# Patient Record
Sex: Female | Born: 1980 | Race: White | Hispanic: No | Marital: Married | Smoking: Never smoker
Health system: Southern US, Community
[De-identification: ages and names within clinical notes are randomized; demographics above are authoritative.]

---

## 2000-08-18 ENCOUNTER — Ambulatory Visit (HOSPITAL_COMMUNITY): Admission: RE | Admit: 2000-08-18 | Discharge: 2000-08-18 | Payer: Self-pay | Admitting: Family Medicine

## 2000-08-18 ENCOUNTER — Encounter: Payer: Self-pay | Admitting: Family Medicine

## 2001-02-16 ENCOUNTER — Emergency Department (HOSPITAL_COMMUNITY): Admission: EM | Admit: 2001-02-16 | Discharge: 2001-02-16 | Payer: Self-pay | Admitting: Emergency Medicine

## 2002-03-06 ENCOUNTER — Encounter: Payer: Self-pay | Admitting: Emergency Medicine

## 2002-03-06 ENCOUNTER — Emergency Department (HOSPITAL_COMMUNITY): Admission: EM | Admit: 2002-03-06 | Discharge: 2002-03-06 | Payer: Self-pay | Admitting: Emergency Medicine

## 2004-01-22 ENCOUNTER — Emergency Department (HOSPITAL_COMMUNITY): Admission: EM | Admit: 2004-01-22 | Discharge: 2004-01-22 | Payer: Self-pay | Admitting: Emergency Medicine

## 2004-04-02 ENCOUNTER — Observation Stay (HOSPITAL_COMMUNITY): Admission: AD | Admit: 2004-04-02 | Discharge: 2004-04-03 | Payer: Self-pay | Admitting: *Deleted

## 2004-04-17 ENCOUNTER — Ambulatory Visit (HOSPITAL_COMMUNITY): Admission: RE | Admit: 2004-04-17 | Discharge: 2004-04-17 | Payer: Self-pay | Admitting: *Deleted

## 2004-06-23 ENCOUNTER — Ambulatory Visit (HOSPITAL_COMMUNITY): Admission: RE | Admit: 2004-06-23 | Discharge: 2004-06-23 | Payer: Self-pay | Admitting: *Deleted

## 2004-07-03 ENCOUNTER — Observation Stay (HOSPITAL_COMMUNITY): Admission: AD | Admit: 2004-07-03 | Discharge: 2004-07-03 | Payer: Self-pay | Admitting: *Deleted

## 2004-07-17 ENCOUNTER — Ambulatory Visit (HOSPITAL_COMMUNITY): Admission: RE | Admit: 2004-07-17 | Discharge: 2004-07-17 | Payer: Self-pay | Admitting: *Deleted

## 2005-03-11 ENCOUNTER — Emergency Department (HOSPITAL_COMMUNITY): Admission: EM | Admit: 2005-03-11 | Discharge: 2005-03-11 | Payer: Self-pay | Admitting: Emergency Medicine

## 2006-02-21 ENCOUNTER — Emergency Department (HOSPITAL_COMMUNITY): Admission: EM | Admit: 2006-02-21 | Discharge: 2006-02-22 | Payer: Self-pay | Admitting: Emergency Medicine

## 2006-02-22 ENCOUNTER — Emergency Department (HOSPITAL_COMMUNITY): Admission: RE | Admit: 2006-02-22 | Discharge: 2006-02-22 | Payer: Self-pay | Admitting: Emergency Medicine

## 2006-04-26 ENCOUNTER — Emergency Department (HOSPITAL_COMMUNITY): Admission: EM | Admit: 2006-04-26 | Discharge: 2006-04-26 | Payer: Self-pay | Admitting: Emergency Medicine

## 2006-06-09 ENCOUNTER — Emergency Department (HOSPITAL_COMMUNITY): Admission: EM | Admit: 2006-06-09 | Discharge: 2006-06-09 | Payer: Self-pay | Admitting: Emergency Medicine

## 2007-10-04 ENCOUNTER — Other Ambulatory Visit: Admission: RE | Admit: 2007-10-04 | Discharge: 2007-10-04 | Payer: Self-pay | Admitting: Obstetrics and Gynecology

## 2007-10-19 ENCOUNTER — Ambulatory Visit (HOSPITAL_COMMUNITY): Admission: RE | Admit: 2007-10-19 | Discharge: 2007-10-19 | Payer: Self-pay | Admitting: Obstetrics & Gynecology

## 2007-11-06 IMAGING — US US OB COMP LESS 14 WK
1 series · 14 of 28 positions shown · non-contrast
Comparison: none

CLINICAL DATA: Cramping lower abdominal area.  Bleeding.  Quantitative beta hCG 2905.  
OBSTETRICAL ULTRASOUND <14 WKS AND TRANSVAGINAL OB US:
TECHNIQUE: Both transabdominal and transvaginal ultrasound examinations were performed for complete evaluation of the gestation as well as the maternal uterus, adnexal regions, and pelvic cul-de-sac.

[Series 1: us ob comp less 14 wk · 0.30mm/px · 14 of 67 slices shown]
[im 3/67]
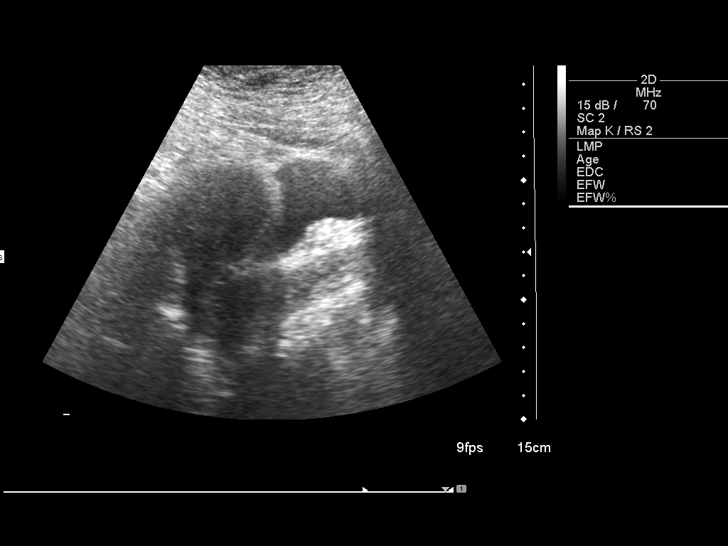
[im 8/67]
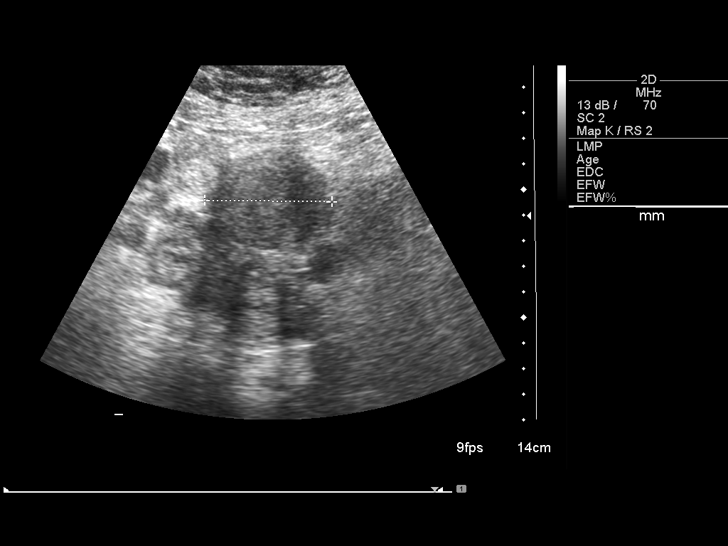
[im 13/67]
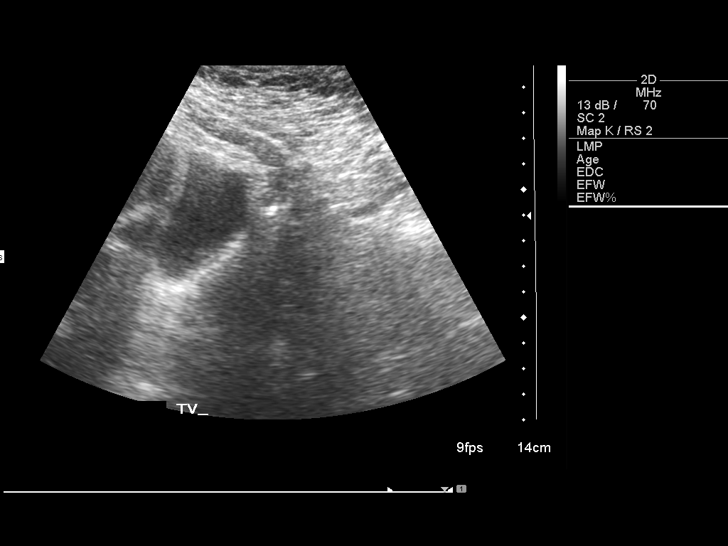
[im 18/67]
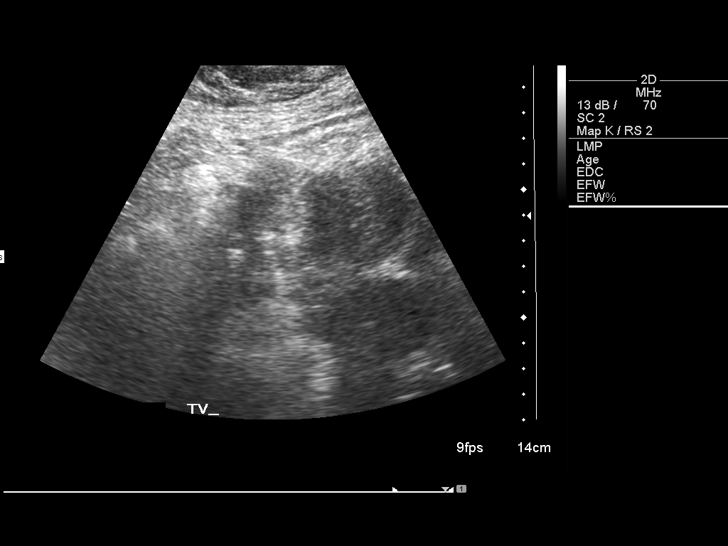
[im 23/67]
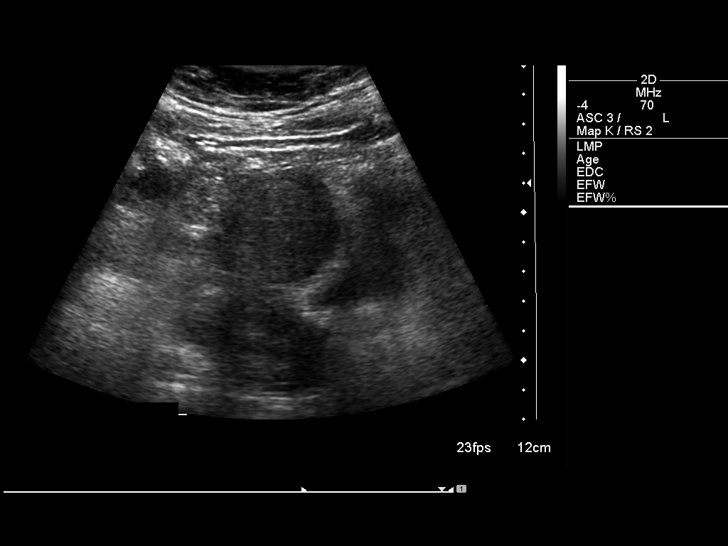
[im 27/67]
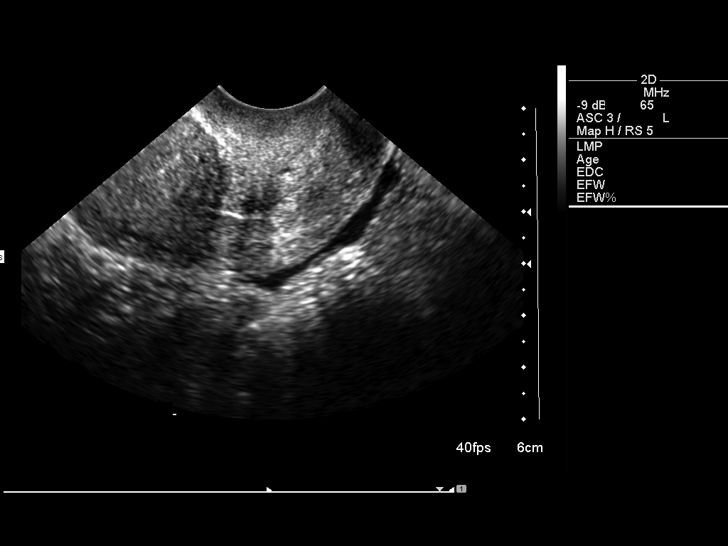
[im 32/67]
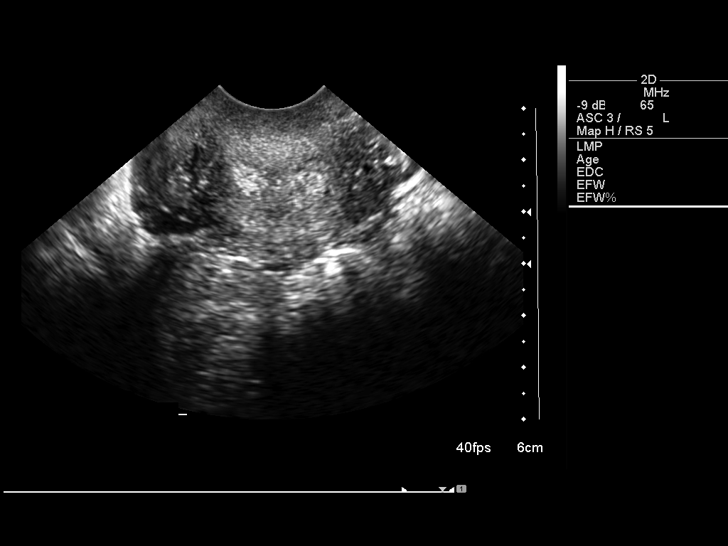
[im 37/67]
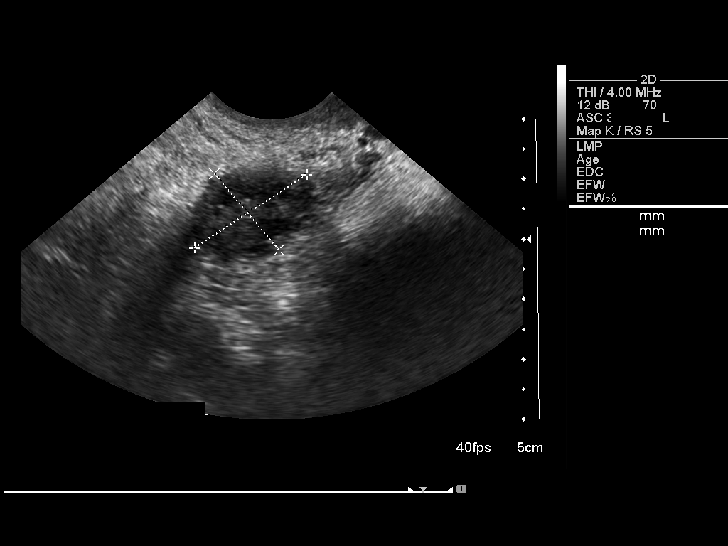
[im 42/67]
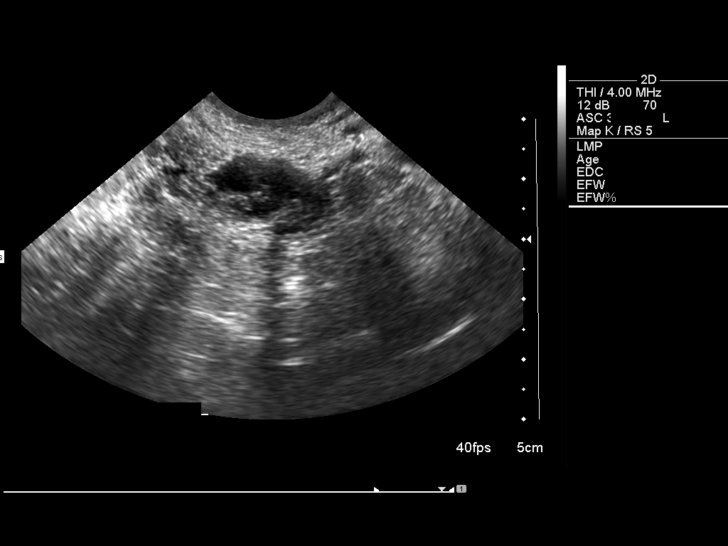
[im 47/67]
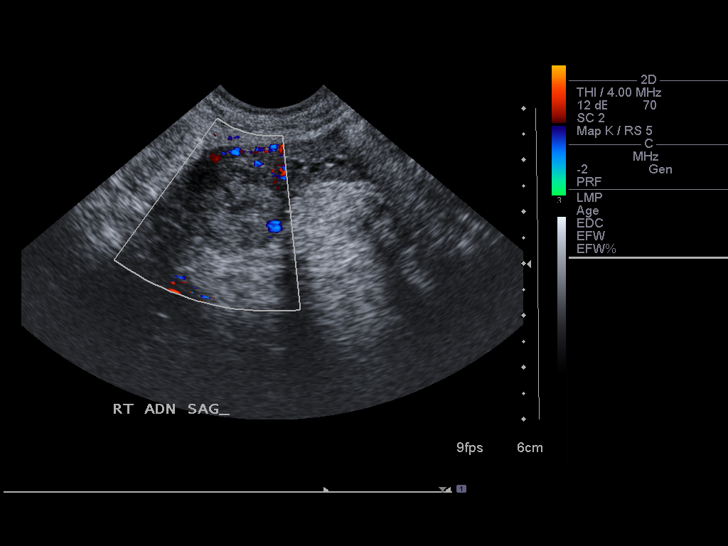
[im 52/67]
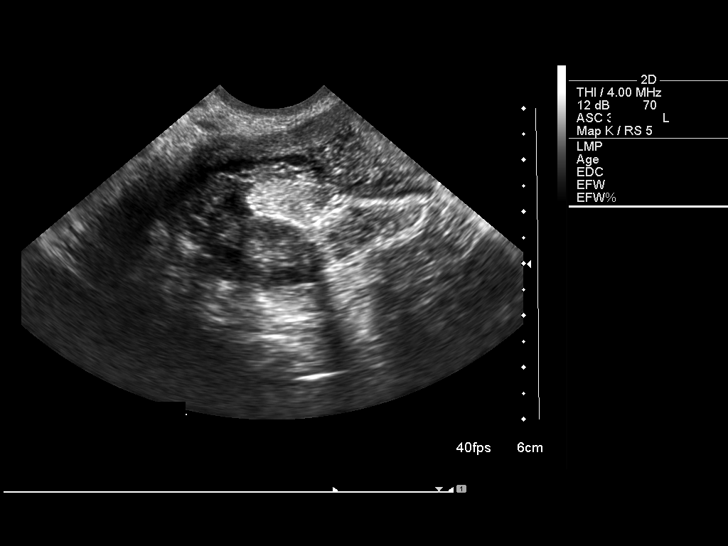
[im 57/67]
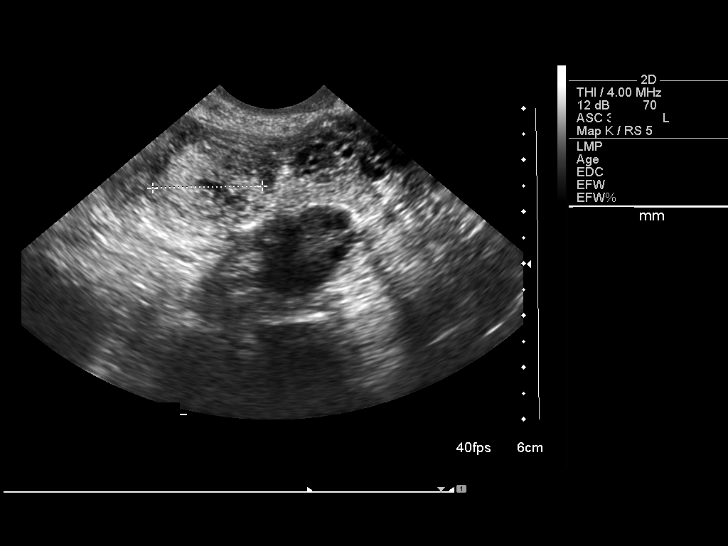
[im 62/67]
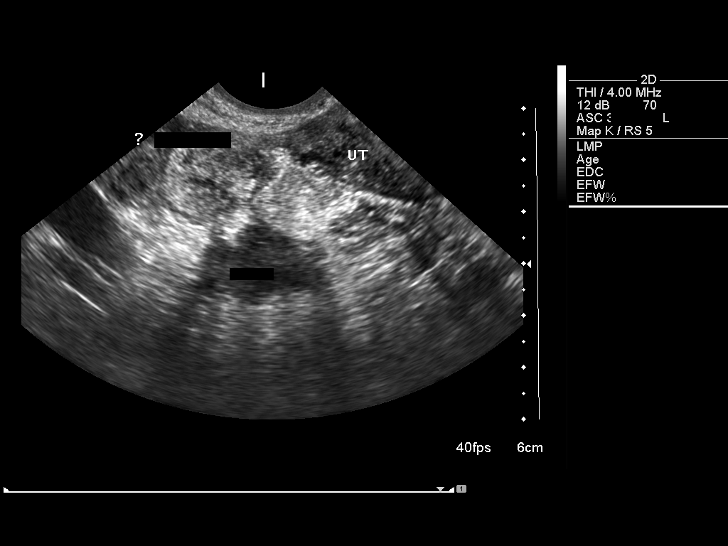
[im 67/67]
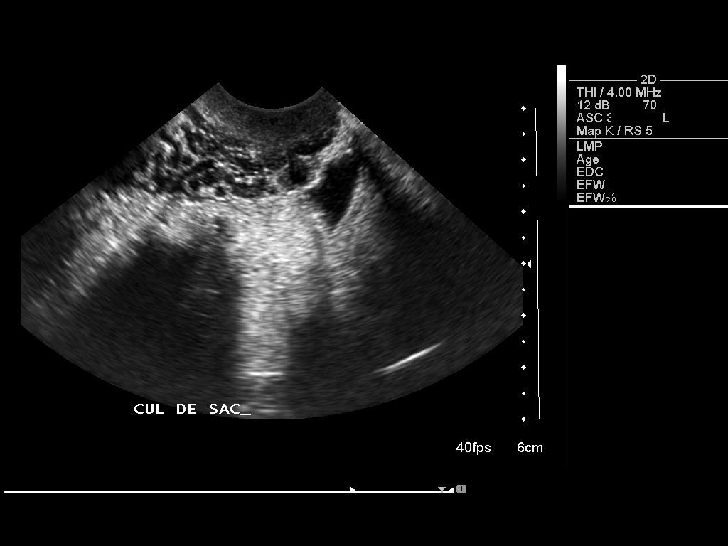

[14 of 28 positions shown; findings below may reference images not displayed]

FINDINGS: No evidence of intrauterine gestational sac, yolk sac, or embryo.  No cardiac activity identified.  Uterus appears unremarkable.  There is a 1.9 x 1.9 oval complex appearing structure in the right adnexa near the right ovary.  It is suspicious for ectopic pregnancy.  The right ovary measures 1.4 x 2.3 cm and the left ovary 1.7 x 2.2 cm.  Small amount of free pelvic fluid.
IMPRESSION: Until proven otherwise, findings are compatible with a right adnexal ectopic pregnancy.

## 2007-11-16 ENCOUNTER — Ambulatory Visit (HOSPITAL_COMMUNITY): Admission: RE | Admit: 2007-11-16 | Discharge: 2007-11-16 | Payer: Self-pay | Admitting: Obstetrics & Gynecology

## 2007-11-29 ENCOUNTER — Emergency Department (HOSPITAL_COMMUNITY): Admission: EM | Admit: 2007-11-29 | Discharge: 2007-11-29 | Payer: Self-pay | Admitting: Emergency Medicine

## 2007-12-14 ENCOUNTER — Ambulatory Visit (HOSPITAL_COMMUNITY): Admission: RE | Admit: 2007-12-14 | Discharge: 2007-12-14 | Payer: Self-pay | Admitting: Obstetrics & Gynecology

## 2007-12-20 ENCOUNTER — Inpatient Hospital Stay (HOSPITAL_COMMUNITY): Admission: AD | Admit: 2007-12-20 | Discharge: 2007-12-20 | Payer: Self-pay | Admitting: Obstetrics & Gynecology

## 2008-01-12 ENCOUNTER — Ambulatory Visit (HOSPITAL_COMMUNITY): Admission: RE | Admit: 2008-01-12 | Discharge: 2008-01-12 | Payer: Self-pay | Admitting: Obstetrics & Gynecology

## 2008-02-09 ENCOUNTER — Ambulatory Visit (HOSPITAL_COMMUNITY): Admission: RE | Admit: 2008-02-09 | Discharge: 2008-02-09 | Payer: Self-pay | Admitting: Obstetrics & Gynecology

## 2008-03-05 ENCOUNTER — Ambulatory Visit: Payer: Self-pay | Admitting: Obstetrics and Gynecology

## 2008-03-05 ENCOUNTER — Inpatient Hospital Stay (HOSPITAL_COMMUNITY): Admission: AD | Admit: 2008-03-05 | Discharge: 2008-03-05 | Payer: Self-pay | Admitting: Obstetrics & Gynecology

## 2008-03-13 ENCOUNTER — Ambulatory Visit: Payer: Self-pay | Admitting: Obstetrics & Gynecology

## 2008-03-13 ENCOUNTER — Other Ambulatory Visit: Payer: Self-pay | Admitting: Obstetrics and Gynecology

## 2008-03-13 ENCOUNTER — Inpatient Hospital Stay (HOSPITAL_COMMUNITY): Admission: AD | Admit: 2008-03-13 | Discharge: 2008-03-15 | Payer: Self-pay | Admitting: Obstetrics & Gynecology

## 2009-07-30 IMAGING — US US OB FOLLOW-UP
1 series · 18 of 28 positions shown · non-contrast
Comparison: none

OBSTETRICAL ULTRASOUND:
 This ultrasound was performed in The [HOSPITAL], and the [facility] report will be stored to [REDACTED] PACS.

[Series 1: us ob follow-up · 18 of 67 slices shown]
[im 1/67]
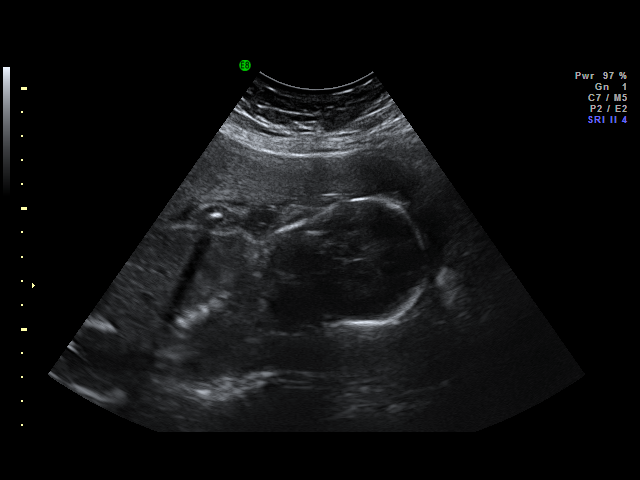
[im 5/67]
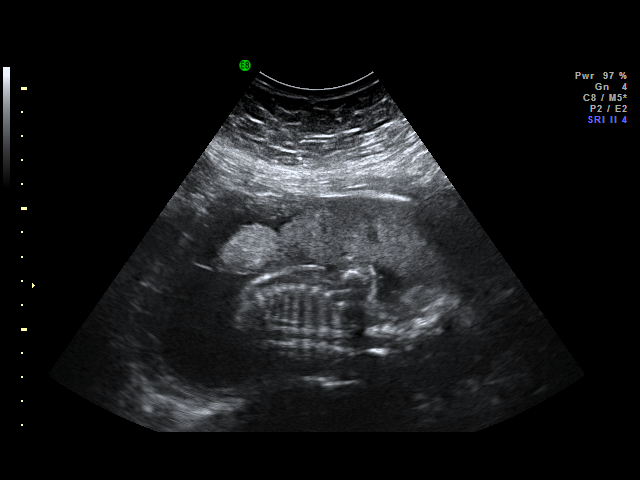
[im 8/67]
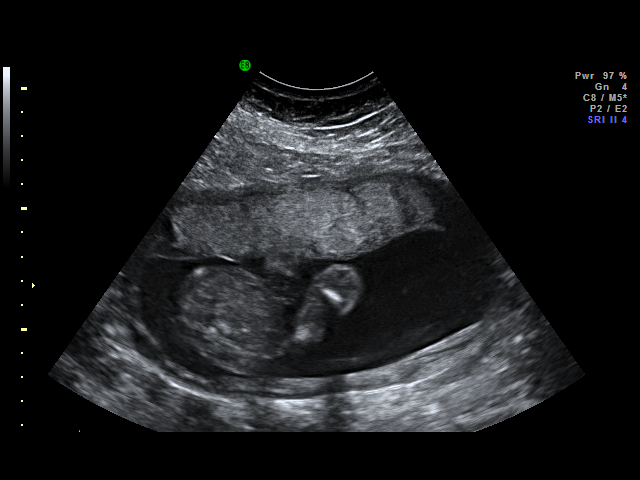
[im 13/67]
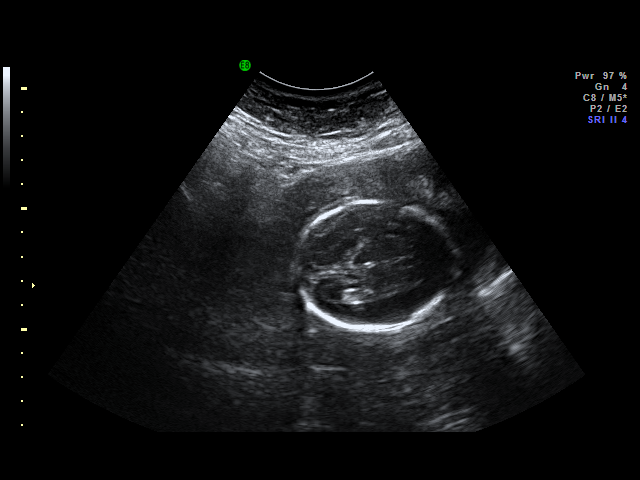
[im 18/67]
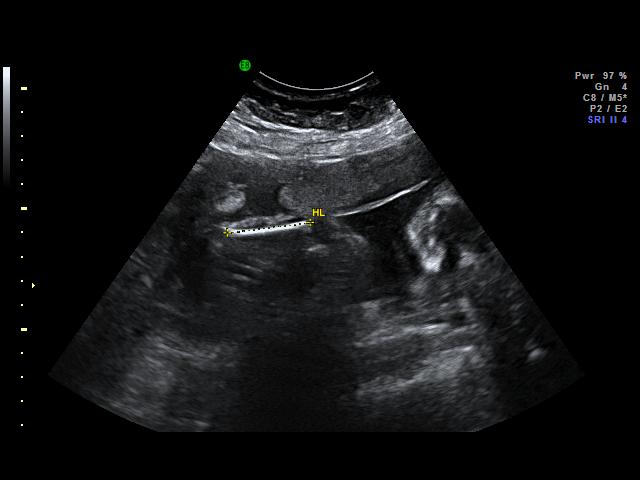
[im 20/67]
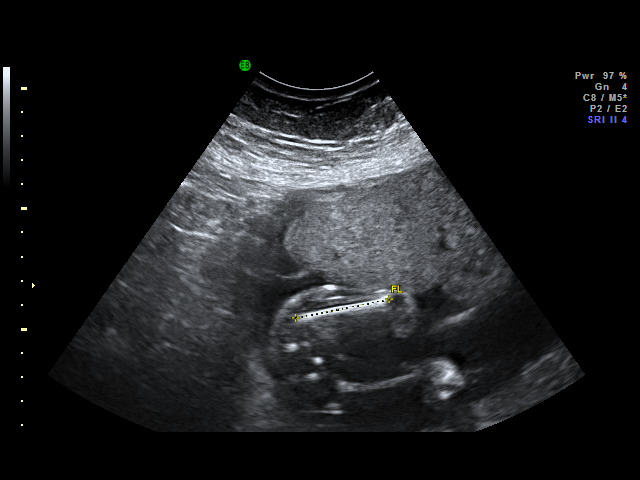
[im 25/67]
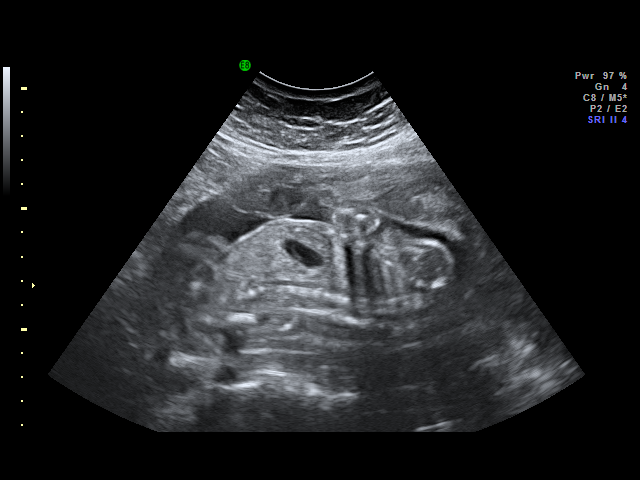
[im 27/67]
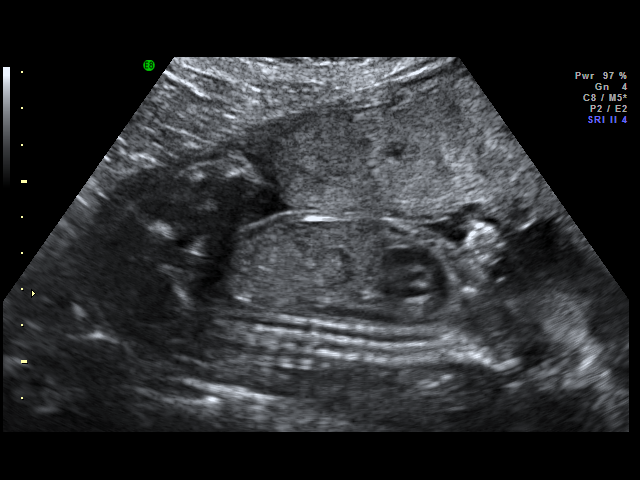
[im 32/67]
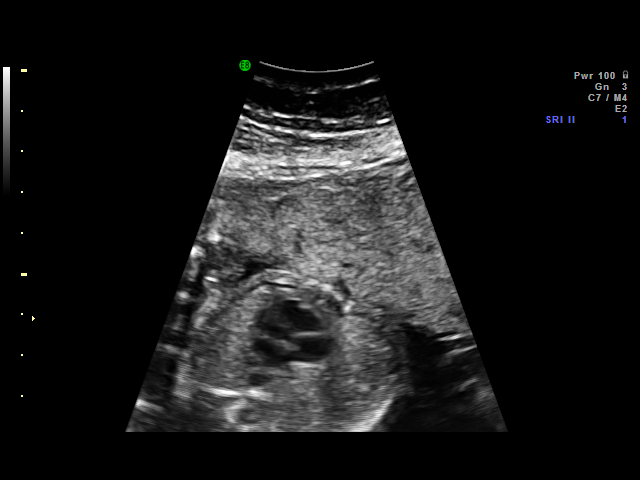
[im 35/67]
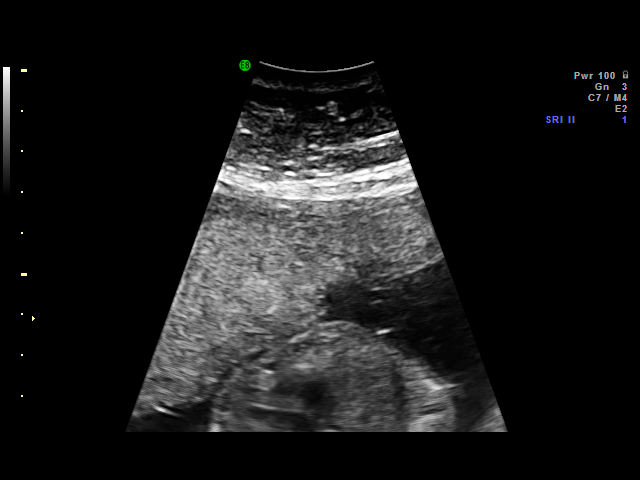
[im 40/67]
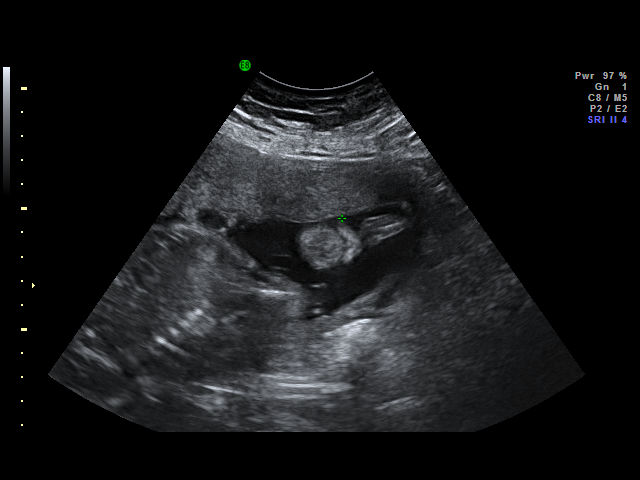
[im 42/67]
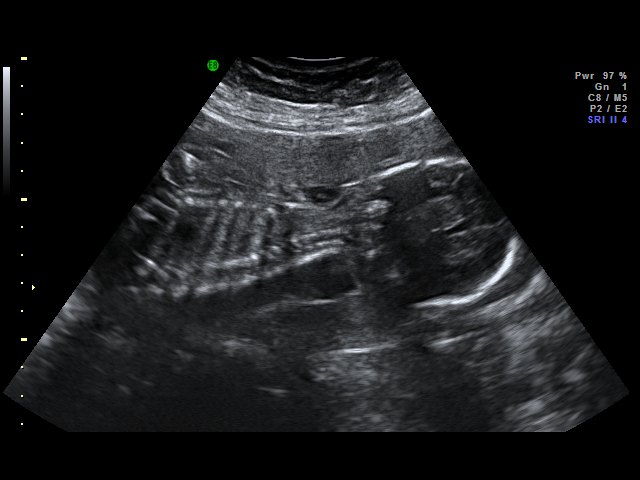
[im 47/67]
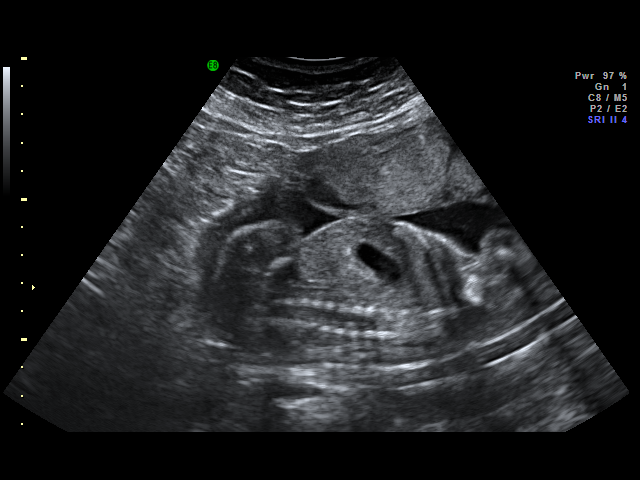
[im 52/67]
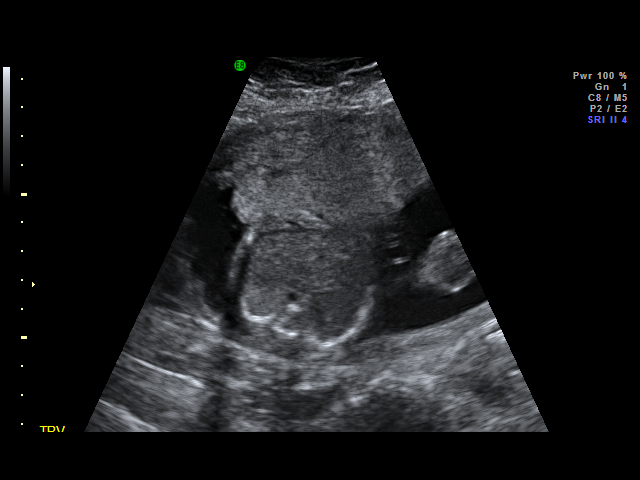
[im 54/67]
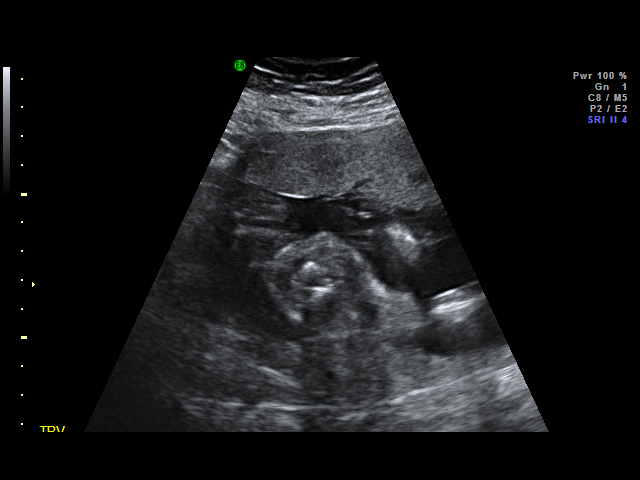
[im 59/67]
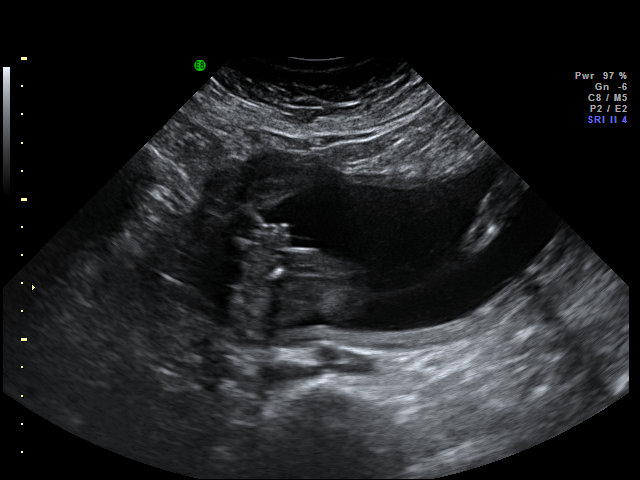
[im 62/67]
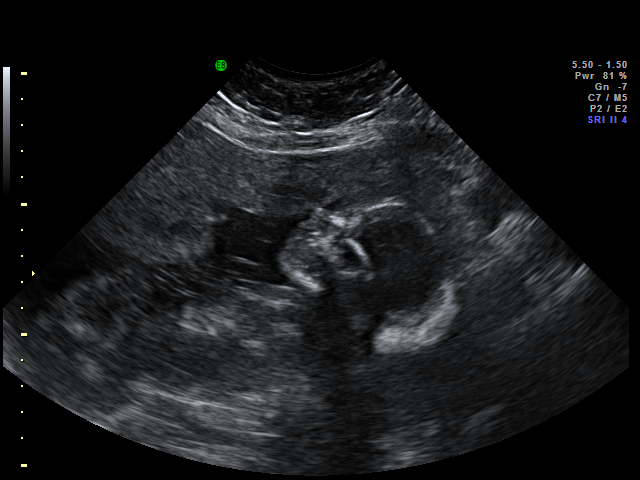
[im 67/67]
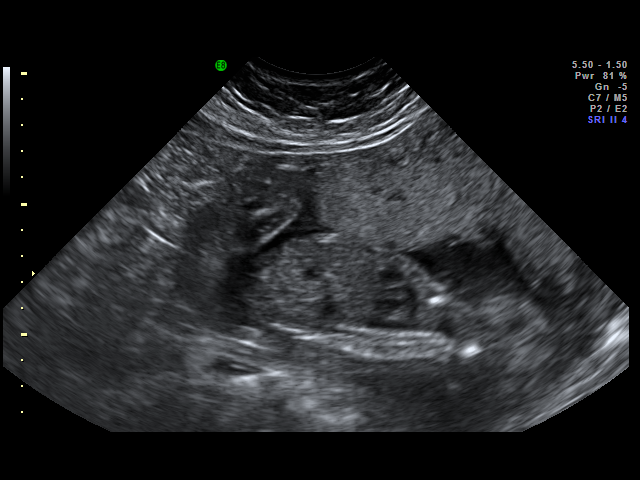

[18 of 28 positions shown; findings below may reference images not displayed]

IMPRESSION: [facility] has also been faxed to the ordering physician.

## 2010-04-17 LAB — COMPREHENSIVE METABOLIC PANEL
AST: 26 U/L (ref 0–37)
Albumin: 2.5 g/dL — ABNORMAL LOW (ref 3.5–5.2)
Calcium: 8.6 mg/dL (ref 8.4–10.5)
GFR calc Af Amer: >60 mL/min (ref >60)
Potassium: 4.1 mEq/L (ref 3.5–5.1)
Total Bilirubin: 0.3 mg/dL (ref 0.3–1.2)
Total Protein: 5.7 g/dL — ABNORMAL LOW (ref 6.0–8.3)

## 2010-04-17 LAB — CBC
HCT: 42.3 % (ref 36.0–46.0)
Hemoglobin: 10.2 g/dL — ABNORMAL LOW (ref 12.0–15.0)
MCV: 90.7 fL (ref 78.0–100.0)
Platelets: 123 10*3/uL — ABNORMAL LOW (ref 150–400)
Platelets: 179 10*3/uL (ref 150–400)
RBC: 3.39 MIL/uL — ABNORMAL LOW (ref 3.87–5.11)
RBC: 4.66 MIL/uL (ref 3.87–5.11)
RDW: 16.4 % — ABNORMAL HIGH (ref 11.5–15.5)
WBC: 9.9 10*3/uL (ref 4.0–10.5)

## 2010-04-17 LAB — URIC ACID: Uric Acid, Serum: 6.8 mg/dL (ref 2.4–7.0)

## 2010-04-17 LAB — RPR: RPR Ser Ql: NONREACTIVE

## 2010-05-20 NOTE — Discharge Summary (Signed)
Tricia Robinson, STROUGH              ACCOUNT NO.:  [id number]   MEDICAL RECORD NO.:  [id number]          PATIENT TYPE:  INP   LOCATION:  9110                          [facility]ACILITY:  [facility]   PHYSICIAN:  Allie Bossier, MD        DATE OF BIRTH:  [birth year]   DATE OF ADMISSION:  03/13/2008  DATE OF DISCHARGE:  03/15/2008                               DISCHARGE SUMMARY   DISCHARGE DIAGNOSES:  1. Status post repeat low-transverse cesarean section.  2. Status post bilateral tubal sterilization.  3. Spontaneous rupture of membranes.   REASON FOR HOSPITALIZATION:  [name] is a 30 year old gravida  4, now para 2-1-1-3 who presented at 77 and 6/7th weeks gestational age  with spontaneous rupture of membranes.  She has history of previous  cesarean section, desired a cesarean section for this delivery.   HOSPITAL COURSE:  The patient was admitted and a repeat cesarean section  was performed.  For further details, please see dictated operative note  for that procedure.  Additionally, she had a bilateral tubal  sterilization at the same time.  She delivered a viable female infant.  Her postpartum hospital course was unremarkable.  At the time of  discharge, her pain is well controlled with oral analgesics, she is  ambulating without difficulty, she is tolerating full diet, she is  passing flatus.  Her postoperative hemoglobin and hematocrit are 10.2  and 30.7 respectively.  The patient will be discharged home in good  condition.   MEDICATIONS AT DISCHARGE:  She will be given prescriptions for prenatal  vitamins, Colace, ibuprofen, and Percocet.   INSTRUCTIONS FOR PATIENT:  The patient was given instructions on taking  her medications as well as signs and symptoms of infection.  The patient  relates she has a followup appointment with [facility] in 1 week.  The  patient is instructed to return to the [facility] for any other issues or  concerns before then.      Odie Sera, DO  Electronically Signed     ______________________________  Allie Bossier, MD    MC/MEDQ  D:  03/15/2008  T:  03/15/2008  Job:  [id number]

## 2010-05-20 NOTE — H&P (Signed)
Tricia Robinson, Tricia Robinson              ACCOUNT NO.:  [id number]   MEDICAL RECORD NO.:  [id number]          PATIENT TYPE:  INP   LOCATION:                                [facility]   PHYSICIAN:  Tilda Burrow, M.D. DATE OF BIRTH:  08-03-1980   DATE OF ADMISSION:  03/14/2008  DATE OF DISCHARGE:                              HISTORY & PHYSICAL   ADMITTING DIAGNOSES:  1. A prior cesarean section, declining trial of labor.  2. History of pelvic fracture as a child with arrest of labor complete      complete after 3 hours pushing with first pregnancy.  3. History of preterm delivery of twins vaginally.  4. Desire for elective permanent sterilization.  5. Down Syndrome, risk of 1 in 29 with a normal genetic ultrasound,      declining amniocentesis.  6. 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   HISTORY OF PRESENT ILLNESS:  This is a 30 year old female, gravida 4,  para 2-1-0-3 with prior cesarean section for a 7-pound 2-ounce infant  after arrest of labor at complete complete after 3 hours of pushing for  cephalopelvic disproportion and subsequent vaginal delivery of vertex,  vertex twins at [redacted] weeks gestation, is admitted for repeat cesarean  section and tubal ligation.  Ms. Mongiello has been seen in our office, has  been counseled over possible vaginal birth after cesarean and declines  consideration of it given her prior difficulties with this infant of  normal-term size.  She additionally desires permanent sterilization and  understands the permanency of the requested procedure.  She signed  Medicaid and tubal sterilization forms in our office on January 31, 2008, and a copy has been sent to [facility].   PAST MEDICAL HISTORY:  Benign.   PAST SURGICAL HISTORY:  Cesarean section, trauma, hip fracture as a  child with a report of deformed pelvis.   ALLERGIES:  None.   MEDICATIONS:  Vitamins.   SOCIAL HISTORY:  Married, Arts development officer.   FAMILY HISTORY:  Positive for diabetes mellitus and cancer.   PRENATAL LABS:  Blood type O positive, rubella immunity present,  hemoglobin 12, hematocrit 38.  Hepatitis, HIV, RPR, GC, and Chlamydia  all negative.  Group B strep positive on December 20, 2007, and February 29, 2008.  Hemoglobin 12.8 and hematocrit 41 at 28 weeks.  Glucose  tolerance test is minimally elevated at 146 on 1-hour test with a normal  3-hour glucose tolerance test at 74/159/118/105.   PHYSICAL EXAMINATION:  GENERAL:  A large-framed, obese Caucasian female.  VITAL SIGNS:  Weight 252 and blood pressure 140/84.  EYES:  Pupils are equal, round, and reactive.  Extraocular movements are  intact.  NECK:  Supple.  CHEST:  Clear to auscultation.  ABDOMEN:  Obese, 40-cm fundal height, gravid uterus with fetal heart  tones noted.  Vertex presentation by digital exam.  Cervix is 1 cm long,  posterior vertex on a very difficult exam with the presenting part quite  high.   PLAN:  Repeat cesarean section and  tubal ligation, March 16, 2008, at  2:15 p.m., [facility].  Addendum:  Patient came in with [facility] on 03/15/08 and cesarean was done by  [facility] MD      Tilda Burrow, M.D.  Electronically Signed     JVF/MEDQ  D:  03/12/2008  T:  03/13/2008  Job:  [id number]   cc:   [facility]

## 2010-05-20 NOTE — Op Note (Signed)
NAMEBEULA, Tricia Robinson              ACCOUNT NO.:  [id number]   MEDICAL RECORD NO.:  [id number]          PATIENT TYPE:  INP   LOCATION:  9110                          [facility]   PHYSICIAN:  Allie Bossier, MD        DATE OF BIRTH:  [birth year]   DATE OF PROCEDURE:  03/13/2008  DATE OF DISCHARGE:                               OPERATIVE REPORT   PREOPERATIVE DIAGNOSES:  1. Spontaneous rupture of membranes.  2. Intrauterine pregnancy at term.  3. History of previous cesarean section.  4. Undesired fertility.   POSTOPERATIVE DIAGNOSES:  1. Spontaneous rupture of membranes.  2. Intrauterine pregnancy at term.  3. History of previous cesarean section.  4. Undesired fertility.   SURGEON:  Dr. Allie Bossier, MD   ASSISTANT:  Dr. Odie Sera, DO.   PROCEDURE:  1. Repeat low-transverse cesarean section.  2. Bilateral tubal sterilization with Filshie clips.   ANESTHESIA:  Spinal and local.   INDICATIONS FOR PROCEDURE:  [name] is a 30 year old gravida  4, para 1-1-1-3 at 38-6/[redacted] weeks gestational age with a history of  previous cesarean section who presented this morning with spontaneous  rupture of membranes.  She does not desire trial of labor and desired a  repeat cesarean section.  Of note, she also desires tubal sterilization  and has previously been counseled on this and has appropriate tubal  papers in her chart.  The risks and benefits of both these procedures  were explained to include, but not limited to bleeding, infection,  damage to internal organs, potential need for further surgery, and a  failure rate of tubal sterilization being approximately 1:200 with the  increased risk of ectopic pregnancy should the failure occur.  The  patient voiced understanding of these risks and desired to proceed with  surgery.   DESCRIPTION OF THE SURGERY:  The patient was taken to the operating room  where spinal anesthesia was introduced.  She was then prepped and draped  in the usual sterile manner.  Appropriate anesthesia was confirmed.  Time-out was conducted.  A Pfannenstiel incision was made in the skin  with a scalpel and carried through the subcutaneous layers to the  fascia.  The fascia was incised in the midline.  The fascial incision  was extended laterally with Mayo scissors.  The rectus muscles were  dissected with electrocautery in the midline and the opening was  extended with manual traction.  The peritoneum was entered bluntly and  the peritoneal opening was also extended with manual traction.  The  appropriate opening to the uterus was obtained and the bladder blade was  placed.  A transverse incision was made in the lower uterine segment  with a scalpel and carried down through the myometrium until bulging  membranes were noted.  The membranes were ruptured bluntly and the  uterine incision was extended laterally with manual traction.  The head  was easily grasped and the bladder blade was removed and the head was  delivered through the uterine incision with the assistance of fundal  pressure.  The mouth and nares  were bulb suctioned and the shoulders  followed by the rest of corpus were delivered without difficulty.  The  mouth and nares were bulb suctioned again and the cord was clamped and  cut.  The infant was handed to the awaiting NICU staff with a  spontaneous cry, good color, and good tone.  The placenta was manually  extracted from the uterus and was intact.  The uterus then cleared of  clots and debris with a dry lap sponge.  The uterine incision was then  closed in a 1 layer closure in a running interlocking manner with 0  Vicryl suture.  Some bleeding was noted from the right lateral aspect of  the uterine incision and a figure-of-eight suture was applied with good  hemostatic results.  Attention was then brought to the tubes.  The right  fallopian tube was identified and with identification of the fimbriated  end.  The tube  was clamped with a Filshie clamp approximately the 2 cm  from the cornu of the uterus.  Then the left fallopian tube was  identified with identification of the fimbria end.  A Filshie clip was  placed approximately 2 cm from the cornu of the uterus without  difficulty.  The tubes were then returned to the abdomen.  The ovaries  were identified and found to be grossly normal.  The uterine incision  was inspected once again and found to be hemostatic.  The fascia was  then closed using loop PDS suture in a running noninterlocking fashion.  Good hemostasis was noted.  There are no defects noted on the fascia.  The subcutaneous tissues were irrigated and found to be hemostatic after  electrocautery applied to a few small sites of bleeding.  The skin was  then closed using 2-0 Vicryl suture in a running subcuticular manner.  Steri-Strips were applied.  A pressure dressing was applied.  The  superior and inferior aspects of the incision were injected with 15 mL  of 0.5% Marcaine each.   FINDINGS:  1. Clear amniotic fluid.  2. Viable female infant  3. Grossly normal adnexa and fallopian tubes.   SPECIMEN:  Placenta.   DISPOSITION:  To labor and delivery.   ESTIMATED BLOOD LOSS:  500 mL.  There are no immediate complications.  The patient is taken to PACU in stable condition.      Odie Sera, DO  Electronically Signed     ______________________________  Allie Bossier, MD    MC/MEDQ  D:  03/13/2008  T:  03/13/2008  Job:  [id number]

## 2010-05-23 NOTE — Discharge Summary (Signed)
NAMELATANA, COLIN            ACCOUNT NO.:  [id number]   MEDICAL RECORD NO.:  [id number]          PATIENT TYPE:  OIB   LOCATION:  LDR3                          FACILITY:  [facility]   PHYSICIAN:  Langley Gauss, MD     DATE OF BIRTH:  1980-09-04   DATE OF ADMISSION:  04/02/2004  DATE OF DISCHARGE:  03/30/2006LH                                 DISCHARGE SUMMARY   OBSTETRICS OBSERVATION NOTE   The patient is a 30 year old [place] female receiving prenatal care through  my office with a known twin gestation, currently at [redacted] weeks gestation who  presents to labor and delivery complaining of a pain on her right side times  two hours duration.  Specifically, the pain is located to the right inguinal  ligament area.  She does report good fetal movement, but is unable to  ascertain separate and distinct movements associated with the twins.  She  denies any vaginal discharge, any leakage of fluid or any vaginal bleeding.  She denies any recent intercourse.  She has had this pain episodically  previously, but for the past two hours duration has been more noted by the  patient.  She denies any uterine contractions, any pelvic or uterine  tightening.  The patient's prenatal course has otherwise been uncomplicated  to date.  She has been tested for gonorrhea and Chlamydia which have been  negative.  The patient was noted to have positive Chlamydia culture during  previous pregnancy which was treated.  Subsequently, all tests of cures have  been negative.  Most recently, she has had an ultrasound done in the office  March 13, 2004 at which time separate and distinct viable pregnancies were  noted with fetal heart rate of 158 and 154.   SOCIAL HISTORY:  Pertinent for a cousin who had a 22-week twin gestation  experiencing spontaneous rupture of membranes with onset of labor and second  trimester fetal loss with two twins delivering extremely prematurely prior  to the threshold of viability and thus  demise resulted.   CURRENT MEDICATIONS:  Prenatal vitamins.  In addition, the patient is on  previous treatment for shingles on her left of her abdomen receiving Valtrex  1 g p.o. b.i.d. times seven days for herpes zoster.   She, however, denies any history of herpes simplex virus.  Denies any  history of herpes or herpetic-type lesions in the vulvar area.   The patient's OB history is pertinent for August 08, 1998 vaginal delivery at  [facility], 7 pounds 2 ounce infant, delivered without  complications.  This is her second pregnancy.   No known drug allergies.   No other medical or surgical history.   PHYSICAL EXAMINATION:  GENERAL:  She is noted to be in no acute distress.  VITAL SIGNS:  Blood pressure 122/70, pulse rate of 80, respiratory rate 20.  HEENT:  Negative.  No adenopathy.  NECK:  Supple.  Thyroid is nonpalpable.  LUNGS:  Clear.  CARDIOVASCULAR:  Regular rate and rhythm.  ABDOMEN:  Soft, nontender.  No surgical scars are identified.  Fundal height  is noted to be  just above the umbilicus.  The uterus itself is soft and  nontender.  No uterine contractions or uterine activity is palpated.  The  pain complained of the patient is localized to the right inguinal ligament  area.  EXTREMITIES:  Noted to be normal.  PELVIC:  Normal external genitalia.  No lesions or ulcerations identified.  No vaginal bleeding.  No leakage of fluid.  Fetal monitor is not utilized,  but Doppler is utilized with documentation of two separate fetal heart rates  of 150s and 155+.  Pelvic exam reveals normal external genitalia.  No  leakage of fluid.  No vaginal bleeding.  No abnormal discharge is  identified.  Digital examination reveals the cervix to be closed, 3 cm long,  firm the consistency.  No presenting part is engaged.   ASSESSMENT AND PLAN:  The patient [redacted] week gestation, twin pregnancy.  Noted  to be in separate sacs by previous ultrasounds.  Two separate fetal heart  tones  auscultated on today's visit.  The patient's history and examination  are consistent with right round ligament pain, right lower quadrant pain.  Signs and symptoms of second trimester miscarriage reviewed with patient.  Subsequently had discussed with her and again reiterated that she is noted  to be a high risk pregnancy and she should limit her activities as much as  possible during this pregnancy.  Currently, she is not employed, thus  limitation of activity should not be a significant problem.   DISPOSITION:  The patient is to keep her next scheduled OB appointment or  follow up as clinically indicated if any problems recur.      DC/MEDQ  D:  04/07/2004  T:  04/07/2004  Job:  [id number]

## 2010-05-23 NOTE — Discharge Summary (Signed)
NAMEPECOLA, HAXTON            ACCOUNT NO.:  [id number]   MEDICAL RECORD NO.:  [id number]          PATIENT TYPE:  OIB   LOCATION:  A415                          FACILITY:  [facility]   PHYSICIAN:  Langley Gauss, MD     DATE OF BIRTH:  [birth year]   DATE OF ADMISSION:  07/17/2004  DATE OF DISCHARGE:  LH                                 DISCHARGE SUMMARY   TRANSFER SUMMARY:  A 30 year old gravida 2, para 1, with known twin  gestation at 49 and 2/7 weeks estimated gestational age.  Presents  complaining of spontaneous rupture of membranes at 0035.  She states she  awoke and she was getting up to eat, at which time she had a large gush of  clear amniotic fluid, sufficient enough to run down her leg.  During the  observation period here, again, she is noted to have copious leakage of  amniotic fluid.  Shortly after arrival here, she did have the onset of mild  uterine contractions about every 5 minutes.   PRENATAL COURSE:  She is known to have twin gestation.  They are noted to be  in separate sacs.   LABORATORY STUDIES:  Glucose tolerance test performed this pregnancy,  normal, at 136.  AFP triple screen, within normal limits.  Serial  ultrasounds have been performed, revealing normal anatomic survey.  Most  recent ultrasound, June 23, 2004, revealed a vertex/vertex presentation with  appropriate symmetric growth of the two female infants as well as normal  amniotic fluid volume.   Patient was seen in the office today, complaining only of pelvic pressure,  at which time she was noted to be closed with no leakage of fluid.  Patient  did not give any history whatsoever which would be suggestive of uterine  contractions with preterm labor.   PAST MEDICAL HISTORY:  She has no known drug allergies.  Prior  hospitalization for primary low transverse cesarean section, 7 pound 1 ounce  female infant.  Rest of descent at complete dilatation.  This C-section was  done by myself, Dr. Lisette Grinder.   There were no complications, no extensions of  the uterine incision occurring during this low transverse cesarean section.  O positive blood type.   SOCIAL HISTORY:  Patient is [place] but she is legal.  She is a nonsmoker.  She does plan on bottle feeding.   PHYSICAL EXAMINATION:  GENERAL:  She appears in no acute distress.  VITAL SIGNS:  Blood pressure is 149/77, pulse rate of 80, respiratory rate  is 20.  HEENT:  Negative.  NECK:  No adenopathy.  Neck is supple.  Thyroid is not palpable.  LUNGS:  Clear.  CARDIOVASCULAR:  Regular rate and rhythm.  ABDOMEN:  Soft and nontender.  Fundal height is 37 cm.  Presenting A is  obviously a vertex presentation by Thayer Ohm maneuver.  PELVIS:  Exam reveals normal external genitalia.  No lesions or ulcerations  identified.  Leakage of fluid is noted.  Serial speculum examination  performed.  GC and chlamydia cultures are performed from the normal-  appearing cervix, with the exception of the clear amniotic  fluid coming out.  The perineum is noted to be normal in appearance.  A group B strep culture  is performed to identify whether she is a carrier.   Patient subsequently did complain of a significant pelvic pressure, stating  she felt like she had a bowel movement.  For those reasons, a single digital  examination was performed by the nursing staff, revealing patient to be 1 cm  dilated.  The vertex of A is not engaged.  A limited OB ultrasound greater  than [redacted] weeks gestation is performed, interpreted by Dr. Roylene Reason. Lisette Grinder.  Reveals fetal cardiac activity x 2, good fetal movement x 2, normal amniotic  fluid volume, decreased in A.  Again, noted to be vertex presentation with  twin B having the head in the right lower quadrant and the thorax extending  upward from there.  External fetal monitor reveals reassuring heart rate x  2, no fetal heart rate decelerations are noted.  Patient is noted to be  contracting about every 5 minutes, palpably  very mild.   ASSESSMENT AND PLAN:  A 31-2/7-week-intrauterine pregnancy, twin gestation,  with spontaneous rupture of membrane.  No evidence of any significant labor.   PLAN:  Will proceed with continuous electronic fetal monitoring.  Arrangements have been made to transfer the patient to [facility] for continued  care.  Patient is to be treated with a 6-gm loading dose of IV magnesium  sulfate followed by a continuous infusion rate of 2 gm per hour.  Additionally, patient received an injection of subcu terbutaline while  efforts were made and were underway to complete the fluid bolus of IV  magnesium sulfate.  Digital examination by myself was not performed, but the  cervix does appear to be minimally open.  Discussed with Dr. Margot Ables, the  case, who has accepted her in transfer.  Will continue tocolysis.  Patient,  in addition, has received betamethasone 12 mg IM x 1.  We will be  transferring the patient to [facility] where they do have the  availability of a level 3 nursery with advanced neonatal care availability.  Patient, additionally, has received 12 mg of IM betamethasone to enhance  fetal lung maturity.  Due to the unknown group B strep status and  prematurity, patient is treated empirically with 2 gm of ampicillin IV  followed by 2 gm per hour.  Group B strep culture was performed here but  results likely not available until tomorrow or Friday.  Patient agrees to  transfer to [place] at this time.       DC/MEDQ  D:  07/17/2004  T:  07/17/2004  Job:  [id number]   cc:   [facility], Maternal Transport

## 2010-10-10 LAB — URINALYSIS, ROUTINE W REFLEX MICROSCOPIC
Hgb urine dipstick: NEGATIVE
Ketones, ur: NEGATIVE mg/dL
Protein, ur: NEGATIVE mg/dL
Specific Gravity, Urine: 1.02 (ref 1.005–1.030)
Urobilinogen, UA: 0.2 mg/dL (ref 0.0–1.0)

## 2010-10-10 LAB — WET PREP, GENITAL: Yeast Wet Prep HPF POC: NONE SEEN

## 2010-10-10 LAB — STREP B DNA PROBE

## 2010-10-10 LAB — GC/CHLAMYDIA PROBE AMP, GENITAL: Chlamydia, DNA Probe: NEGATIVE

## 2010-10-23 LAB — DIFFERENTIAL
Eosinophils Absolute: 0.2
Eosinophils Relative: 2
Lymphocytes Relative: 28
Lymphs Abs: 2.8
Neutro Abs: 6.5
Neutrophils Relative %: 63

## 2010-10-23 LAB — URINALYSIS, ROUTINE W REFLEX MICROSCOPIC
Glucose, UA: NEGATIVE
Nitrite: NEGATIVE
Protein, ur: NEGATIVE
Urobilinogen, UA: 1

## 2010-10-23 LAB — PREGNANCY, URINE: Preg Test, Ur: NEGATIVE

## 2010-10-23 LAB — CBC
MCHC: 33.3
Platelets: 276
RBC: 4.61
RDW: 16.7 — ABNORMAL HIGH

## 2014-07-04 ENCOUNTER — Encounter: Payer: Self-pay | Admitting: *Deleted

## 2014-07-04 ENCOUNTER — Ambulatory Visit: Payer: Self-pay | Attending: Oncology | Admitting: *Deleted

## 2014-07-04 VITALS — BP 115/81 | HR 85 | Temp 97.0°F | Resp 18 | Ht 62.99 in | Wt 212.4 lb

## 2014-07-04 DIAGNOSIS — N644 Mastodynia: Secondary | ICD-10-CM

## 2014-07-04 NOTE — Progress Notes (Signed)
Subjective:     Patient ID: Tricia Robinson, female   DOB: [birth year], 34 y.o.   MRN: [id number]  HPI   Review of Systems     Objective:   Physical Exam  Pulmonary/Chest: Right breast exhibits no inverted nipple, no mass, no nipple discharge, no skin change and no tenderness. Left breast exhibits no inverted nipple, no mass, no nipple discharge, no skin change and no tenderness. Breasts are asymmetrical.         Assessment:     34 year old English speaking Hispanic female referred to [facility] by [facility] for complaints of left breast pain.  Patient states she had a 4 day episode of targeted left breast pain at 12:00 in the upper chest area.  States the pain completely resolved after her visit with her primary care provider.  States she does have generalized breast tenderness.  Does drink caffeine products.  Patient was concerned since she has a family history of breast cancer in a maternal aunt and great aunt.      Plan:     Patient is to reduce caffeine, try OTC vitamin E, continue self breast awareness, and if she notices any masses or targeted pain, she is to call for a follow-up appointment. Hand-out on breast pain given to patient.  Encouraged to get annual screening mammograms at age 34.

## 2016-06-10 ENCOUNTER — Encounter: Payer: Self-pay | Admitting: *Deleted

## 2016-06-10 ENCOUNTER — Emergency Department
Admission: EM | Admit: 2016-06-10 | Discharge: 2016-06-10 | Disposition: A | Discharge status: Home / Self Care | Payer: Self-pay | Attending: Emergency Medicine | Admitting: Emergency Medicine

## 2016-06-10 DIAGNOSIS — N61 Mastitis without abscess: Secondary | ICD-10-CM | POA: Insufficient documentation

## 2016-06-10 MED ORDER — SULFAMETHOXAZOLE-TRIMETHOPRIM 800-160 MG PO TABS
ORAL_TABLET | ORAL | Status: AC
  Administered 2016-06-10: 1 via ORAL
  Filled 2016-06-10: qty 1

## 2016-06-10 MED ORDER — SULFAMETHOXAZOLE-TRIMETHOPRIM 800-160 MG PO TABS
1.0000 | ORAL_TABLET | Freq: Once | ORAL | Status: AC
  Administered 2016-06-10: 1 via ORAL

## 2016-06-10 MED ORDER — SULFAMETHOXAZOLE-TRIMETHOPRIM 800-160 MG PO TABS: 1.0000 | ORAL_TABLET | Freq: Two times a day (BID) | ORAL | 0 refills | Status: AC

## 2016-06-10 NOTE — ED Triage Notes (Signed)
Pt has pain below right breast.  States feels a knot and bra makes it hurt worse.  Pt alert

## 2016-06-10 NOTE — ED Notes (Signed)
Pt reports feeling a "knot" under her RIGHT breast that she discovered 1.5 hrs PTA. Pt reports tenderness upon palpation, denies warmth, redness, swelling or drainage. No fever at home, denies N/V.

## 2016-06-10 NOTE — ED Provider Notes (Signed)
[facility] Emergency Department Provider Note  ____________________________________________  Time seen: Approximately 7:29 PM  I have reviewed the triage vital signs and the nursing notes.   HISTORY  Chief Complaint Breast Mass    HPI Tricia Robinson is a 36 y.o. female who presents to emergency department complaining of "knot" under her right breast. Patient reports that she first noticed the pain and swelling today. Patient denies any fevers or chills. No recent bacterial skin infections. No recent antibiotic use. Patient reports that not is in the skin fold under right breast. She denies any drainage. She denies any pain in the actual breast tissue. No drainage from the nipple. No other complaints at this time.   No past medical history on file.  There are no active problems to display for this patient.   No past surgical history on file.  Prior to Admission medications   Medication Sig Start Date End Date Taking? Authorizing Provider  sulfamethoxazole-trimethoprim (BACTRIM DS,SEPTRA DS) 800-160 MG tablet Take 1 tablet by mouth 2 (two) times daily. 06/10/16   Samanyu Tinnell, Delorise Royals, PA-C    Allergies Patient has no known allergies.  No family history on file.  Social History Social History  Substance Use Topics  . Smoking status: Never Smoker  . Smokeless tobacco: Never Used  . Alcohol use No     Review of Systems  Constitutional: No fever/chills Cardiovascular: no chest pain. Respiratory: no cough. No SOB. Gastrointestinal: No abdominal pain.  No nausea, no vomiting.   Musculoskeletal: Negative for musculoskeletal pain. Skin: Positive for painful "knot" under her right breast Neurological: Negative for headaches, focal weakness or numbness. 10-point ROS otherwise negative.  ____________________________________________   PHYSICAL EXAM:  VITAL SIGNS: ED Triage Vitals  Enc Vitals Group     BP 06/10/16 1918 130/82     Pulse Rate  06/10/16 1918 92     Resp 06/10/16 1918 20     Temp 06/10/16 1918 98.8 F (37.1 C)     Temp Source 06/10/16 1918 Oral     SpO2 06/10/16 1918 98 %     Weight 06/10/16 1917 211 lb (95.7 kg)     Height 06/10/16 1917 5\' 2"  (1.575 m)     Head Circumference --      Peak Flow --      Pain Score 06/10/16 1916 4     Pain Loc --      Pain Edu? --      Excl. in GC? --      Constitutional: Alert and oriented. Well appearing and in no acute distress. Eyes: Conjunctivae are normal. PERRL. EOMI. Head: Atraumatic. Neck: No stridor.    Cardiovascular: Normal rate, regular rhythm. Normal S1 and S2.  Good peripheral circulation. Respiratory: Normal respiratory effort without tachypnea or retractions. Lungs CTAB. Good air entry to the bases with no decreased or absent breath sounds. Musculoskeletal: Full range of motion to all extremities. No gross deformities appreciated. Neurologic:  Normal speech and language. No gross focal neurologic deficits are appreciated.  Skin:  Skin is warm, dry and intact. No rash noted. Erythematous and edematous skin lesion noted in the skin fold under right breast. Area is firm to palpation but no fluctuance or induration. Area measures approximately 1 cm in diameter. No drainage. Psychiatric: Mood and affect are normal. Speech and behavior are normal. Patient exhibits appropriate insight and judgement.   ____________________________________________   LABS (all labs ordered are listed, but only abnormal results are displayed)  Labs  Reviewed - No data to display ____________________________________________  EKG   ____________________________________________  RADIOLOGY   No results found.  ____________________________________________    PROCEDURES  Procedure(s) performed:    Procedures    Medications - No data to display   ____________________________________________   INITIAL IMPRESSION / ASSESSMENT AND PLAN / ED COURSE  Pertinent labs &  imaging results that were available during my care of the patient were reviewed by me and considered in my medical decision making (see chart for details).  Review of the [place] CSRS was performed in accordance of the [facility] prior to dispensing any controlled drugs.     Patient's diagnosis is consistent with cellulitis to the skin fold underneath the right breast. No indication of abscess requiring incision and drainage at this time. No indication for labs or imaging.. Patient will be discharged home with prescriptions for antibiotics. Patient is to follow up with primary care as needed or otherwise directed. Patient is given ED precautions to return to the ED for any worsening or new symptoms.     ____________________________________________  FINAL CLINICAL IMPRESSION(S) / ED DIAGNOSES  Final diagnoses:  Cellulitis of right breast      NEW MEDICATIONS STARTED DURING THIS VISIT:  New Prescriptions   SULFAMETHOXAZOLE-TRIMETHOPRIM (BACTRIM DS,SEPTRA DS) 800-160 MG TABLET    Take 1 tablet by mouth 2 (two) times daily.        This chart was dictated using voice recognition software/Dragon. Despite best efforts to proofread, errors can occur which can change the meaning. Any change was purely unintentional.    Racheal PatchesCuthriell, Thais Silberstein D, PA-C 06/10/16 1945    Emily FilbertWilliams, Armanii Urbanik E, MD 06/10/16 2245

## 2017-10-11 ENCOUNTER — Encounter (INDEPENDENT_AMBULATORY_CARE_PROVIDER_SITE_OTHER): Payer: Self-pay

## 2017-10-11 ENCOUNTER — Encounter: Payer: Self-pay | Admitting: *Deleted

## 2017-10-11 ENCOUNTER — Ambulatory Visit: Payer: Self-pay | Attending: Oncology | Admitting: *Deleted

## 2017-10-11 ENCOUNTER — Ambulatory Visit
Admission: RE | Admit: 2017-10-11 | Discharge: 2017-10-11 | Disposition: A | Discharge status: Home / Self Care | Payer: Self-pay | Source: Ambulatory Visit | Attending: Oncology | Admitting: Oncology

## 2017-10-11 VITALS — BP 127/86 | HR 80 | Temp 96.1°F | Resp 18 | Ht 62.0 in | Wt 206.6 lb

## 2017-10-11 DIAGNOSIS — Z0000 Encounter for general adult medical examination without abnormal findings: Secondary | ICD-10-CM

## 2017-10-11 NOTE — Patient Instructions (Signed)
Gave patient hand-out, Women Staying Healthy, Active and Well from [facility], with education on breast health, pap smears, heart and colon health. 

## 2017-10-11 NOTE — Progress Notes (Signed)
  Subjective:     Patient ID: Tricia Robinson, female   DOB: Mar 14, 1980, 37 y.o.   MRN: [id number]  HPI   Review of Systems     Objective:   Physical Exam  Pulmonary/Chest: Right breast exhibits no inverted nipple, no mass, no nipple discharge, no skin change and no tenderness. Left breast exhibits no inverted nipple, no mass, no nipple discharge, no skin change and no tenderness. Breasts are asymmetrical.  Right breast larger than the left         Assessment:     37 year old English speaking Hispanic female referred to [facility] by Jeralyn Ruths for complaints of bilateral breast pain.  Patient states she has had a history of bilateral breast pain since her daughter was born in 2011.  States it is non-cyclic, and is greater on the left than on the right.  States the pain is intermittent, but when it comes, it's constant and can last up to a week.  States it non-targeted.  States it can be at different places in the breast each time. Rates her pain an 8 on a 0/10 scale.  States she does not take IBU or Tylenol, because it has never helped in the past.  No aggravating factors. States she only drinks caffeine about 3 times a week.  On clinical breast exam there is notable fibroglandular like tissue at bilateral outer quadrants.  There is no mass, skin changes, nipple discharge or lymphadenopathy.  The patient states a year ago she expressed a "clear" discharge from the right nipple one time.  Taught self breast awareness. Patient has been screened for eligibility.  She does not have any insurance, Medicare or Medicaid.  She also meets financial eligibility.  Hand-out given on the Affordable Care Act. Risk Assessment    Risk Scores      10/11/2017   Last edited by: Glory Buff, RN   5-year risk: 0.8 %   Lifetime risk: 19.7 %            Plan:     Ordered screening mammogram to be paid out of [employer].  Discussed to decrease caffeine intake, try vitamin E or Evening Primrose  Oil.  She was encouraged to talk with the pharmacist prior to taking OTC meds.  Also discussed use of a topical nonsteroidal like Diclofenac.  Since this is a prescription med, I asked her to discuss this with her primary care provider.  She is agreeable to the plan.

## 2017-10-13 ENCOUNTER — Encounter: Payer: Self-pay | Admitting: *Deleted

## 2017-10-13 NOTE — Progress Notes (Signed)
Letter mailed from the [facility] to inform patient of her normal mammogram results.  Patient is to follow-up with annual screening at age 37.  [facility] to [place].
